# Patient Record
Sex: Male | Born: 2000 | Race: Black or African American | Hispanic: No | Marital: Single | Smoking: Never smoker
Health system: Southern US, Community
[De-identification: ages and names within clinical notes are randomized; demographics above are authoritative.]

---
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
## 2019-06-04 ENCOUNTER — Ambulatory Visit: Payer: Medicaid Other

## 2020-01-14 ENCOUNTER — Emergency Department: Payer: Medicaid Other

## 2020-01-14 ENCOUNTER — Emergency Department
Admission: EM | Admit: 2020-01-14 | Discharge: 2020-01-14 | Disposition: A | Discharge status: Home / Self Care | Payer: Medicaid Other | Attending: Emergency Medicine | Admitting: Emergency Medicine

## 2020-01-14 DIAGNOSIS — M546 Pain in thoracic spine: Secondary | ICD-10-CM | POA: Insufficient documentation

## 2020-01-14 DIAGNOSIS — M542 Cervicalgia: Secondary | ICD-10-CM | POA: Insufficient documentation

## 2020-01-14 DIAGNOSIS — R519 Headache, unspecified: Secondary | ICD-10-CM | POA: Insufficient documentation

## 2020-01-14 DIAGNOSIS — M25551 Pain in right hip: Secondary | ICD-10-CM | POA: Diagnosis not present

## 2020-01-14 DIAGNOSIS — Y92410 Unspecified street and highway as the place of occurrence of the external cause: Secondary | ICD-10-CM | POA: Insufficient documentation

## 2020-01-14 MED ORDER — CYCLOBENZAPRINE HCL 5 MG PO TABS: ORAL_TABLET | ORAL | 0 refills | Status: DC
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
## 2021-04-20 ENCOUNTER — Encounter: Payer: Self-pay | Admitting: Emergency Medicine

## 2021-04-20 ENCOUNTER — Other Ambulatory Visit: Payer: Self-pay

## 2021-04-20 ENCOUNTER — Emergency Department
Admission: EM | Admit: 2021-04-20 | Discharge: 2021-04-20 | Disposition: A | Discharge status: Home / Self Care | Payer: Medicaid Other | Attending: Emergency Medicine | Admitting: Emergency Medicine

## 2021-04-20 DIAGNOSIS — R112 Nausea with vomiting, unspecified: Secondary | ICD-10-CM | POA: Diagnosis present

## 2021-04-20 DIAGNOSIS — E860 Dehydration: Secondary | ICD-10-CM | POA: Diagnosis not present

## 2021-04-20 DIAGNOSIS — U071 COVID-19: Secondary | ICD-10-CM | POA: Diagnosis not present

## 2021-04-20 LAB — URINALYSIS, ROUTINE W REFLEX MICROSCOPIC
Glucose, UA: NEGATIVE mg/dL
Hgb urine dipstick: NEGATIVE
Ketones, ur: 80 mg/dL — AB
Nitrite: NEGATIVE
Protein, ur: >=300 mg/dL — AB
Specific Gravity, Urine: 1.033 — ABNORMAL HIGH (ref 1.005–1.030)
pH: 9 — ABNORMAL HIGH (ref 5.0–8.0)

## 2021-04-20 LAB — CBC
HCT: 44.6 % (ref 39.0–52.0)
Hemoglobin: 14.8 g/dL (ref 13.0–17.0)
MCH: 27.2 pg (ref 26.0–34.0)
MCHC: 33.2 g/dL (ref 30.0–36.0)
MCV: 82 fL (ref 80.0–100.0)
Platelets: 233 10*3/uL (ref 150–400)
RBC: 5.44 MIL/uL (ref 4.22–5.81)
RDW: 13.4 % (ref 11.5–15.5)
WBC: 7.3 10*3/uL (ref 4.0–10.5)
nRBC: 0 % (ref 0.0–0.2)

## 2021-04-20 LAB — COMPREHENSIVE METABOLIC PANEL
ALT: 22 U/L (ref 0–44)
AST: 24 U/L (ref 15–41)
Albumin: 4.6 g/dL (ref 3.5–5.0)
Alkaline Phosphatase: 70 U/L (ref 38–126)
Anion gap: 11 (ref 5–15)
BUN: 12 mg/dL (ref 6–20)
CO2: 21 mmol/L — ABNORMAL LOW (ref 22–32)
Calcium: 9.4 mg/dL (ref 8.9–10.3)
Chloride: 103 mmol/L (ref 98–111)
Creatinine, Ser: 1.02 mg/dL (ref 0.61–1.24)
GFR, Estimated: >60 mL/min (ref >60)
Glucose, Bld: 106 mg/dL — ABNORMAL HIGH (ref 70–99)
Potassium: 3.9 mmol/L (ref 3.5–5.1)
Sodium: 135 mmol/L (ref 135–145)
Total Bilirubin: 0.9 mg/dL (ref 0.3–1.2)
Total Protein: 7.9 g/dL (ref 6.5–8.1)

## 2021-04-20 LAB — RESP PANEL BY RT-PCR (FLU A&B, COVID) ARPGX2
Influenza A by PCR: NEGATIVE
Influenza B by PCR: NEGATIVE
SARS Coronavirus 2 by RT PCR: POSITIVE — AB

## 2021-04-20 LAB — LACTIC ACID, PLASMA: Lactic Acid, Venous: 1.4 mmol/L (ref 0.5–1.9)

## 2021-04-20 LAB — LIPASE, BLOOD: Lipase: 24 U/L (ref 11–51)

## 2021-04-20 MED ORDER — ONDANSETRON HCL 4 MG/2ML IJ SOLN
4.0000 mg | INTRAMUSCULAR | Status: AC
  Administered 2021-04-20: 4 mg via INTRAVENOUS
  Filled 2021-04-20: qty 2

## 2021-04-20 MED ORDER — LACTATED RINGERS IV BOLUS
1000.0000 mL | Freq: Once | INTRAVENOUS | Status: AC
  Administered 2021-04-20: 1000 mL via INTRAVENOUS

## 2021-04-20 MED ORDER — ONDANSETRON 4 MG PO TBDP: ORAL_TABLET | ORAL | 0 refills | Status: AC

## 2021-04-20 NOTE — ED Notes (Signed)
Pt refused discharge vitals at this time.

## 2021-04-20 NOTE — ED Provider Notes (Signed)
[facility] Provider Note    Event Date/Time   First MD Initiated Contact with Patient 04/20/21 [phone]     (approximate)   History   Nausea and Emesis   HPI  Jorge Mccullough is a 21 y.o. male who reports no chronic medical issues and presents for evaluation of persistent nausea and vomiting.  He said has been going on for 2 days.  He said he feels generally weak but it may be because he is not able to keep much down when he eats or drinks.  He denies fever, sore throat, chest pain, shortness of breath, and cough.  He has no pain, just the persistent nausea.  He last vomited before coming to the emergency department but he has been able to drink some water and eat some ice after coming to the ED.  Nothing in particular makes the symptoms better or worse.     Physical Exam   Triage Vital Signs: ED Triage Vitals  Enc Vitals Group     BP 04/20/21 0026 133/78     Pulse Rate 04/20/21 0026 (!) 101     Resp 04/20/21 0026 18     Temp 04/20/21 0026 100.1 F (37.8 C)     Temp src --      SpO2 04/20/21 0026 98 %     Weight 04/20/21 0026 83.9 kg (185 lb)     Height 04/20/21 0026 1.905 m (6\' 3" )     Head Circumference --      Peak Flow --      Pain Score 04/20/21 0026 0     Pain Loc --      Pain Edu? --      Excl. in GC? --     Most recent vital signs: Vitals:   04/20/21 0026 04/20/21 0125  BP: 133/78 132/84  Pulse: (!) 101   Resp: 18   Temp: 100.1 F (37.8 C)   SpO2: 98%      General: Awake, no distress.  CV:  Good peripheral perfusion.  Resp:  Normal effort.  Abd:  No distention.  No tenderness to palpation of the abdomen.   ED Results / Procedures / Treatments   Labs (all labs ordered are listed, but only abnormal results are displayed) Labs Reviewed  RESP PANEL BY RT-PCR (FLU A&B, COVID) ARPGX2 - Abnormal; Notable for the following components:      Result Value   SARS Coronavirus 2 by RT PCR POSITIVE (*)    All other components  within normal limits  COMPREHENSIVE METABOLIC PANEL - Abnormal; Notable for the following components:   CO2 21 (*)    Glucose, Bld 106 (*)    All other components within normal limits  URINALYSIS, ROUTINE W REFLEX MICROSCOPIC - Abnormal; Notable for the following components:   Color, Urine AMBER (*)    APPearance HAZY (*)    Specific Gravity, Urine 1.033 (*)    pH 9.0 (*)    Bilirubin Urine SMALL (*)    Ketones, ur 80 (*)    Protein, ur >=300 (*)    Leukocytes,Ua TRACE (*)    Bacteria, UA RARE (*)    All other components within normal limits  URINE CULTURE  LIPASE, BLOOD  CBC  LACTIC ACID, PLASMA  LACTIC ACID, PLASMA      MEDICATIONS ORDERED IN ED: Medications  lactated ringers bolus 1,000 mL (0 mLs Intravenous Stopped 04/20/21 0338)  ondansetron (ZOFRAN) injection 4 mg (4 mg Intravenous Given  04/20/21 0119)     IMPRESSION / MDM / ASSESSMENT AND PLAN / ED COURSE  I reviewed the triage vital signs and the nursing notes.                              Differential diagnosis includes, but is not limited to, viral infection, gastritis, acid reflux.  The patient is having no pains of biliary colic would be unlikely.  He has a reassuring physical exam, is well-appearing, no longer vomiting.  However his temperature is slightly elevated at 100.1 and his pulse is elevated at 101.  His blood pressure is normal.  I ordered respiratory viral panel, comprehensive metabolic panel, lipase, CBC with differential, urinalysis, urine culture, and lactic acid.  I reviewed the labs and his urinalysis shows some ketones and some protein and a little bit of blood, unclear clinical significance in the absence of any dysuria or other urological symptoms.  His CBC is within normal limits including no leukocytosis.  Lipase is normal, lactic acid is normal, and comprehensive metabolic panel is normal including no evidence of kidney dysfunction or LFT dysfunction.  However his respiratory viral panel is  positive for COVID-19, which does explain his vital sign abnormalities as well as provides an explanation for his nausea and vomiting.  I ordered LR 1 L IV bolus and Zofran 4 mg IV, after which he said he felt much better.  He was surprised by the COVID-19 diagnosis but understands its importance.  We had my usual and customary discussion about COVID-19 and he wishes to follow-up as an outpatient.  I explained to him about paxlovid and he declines that at this time in favor of a prescription for Zofran.  I gave my usual and customary return precautions.         FINAL CLINICAL IMPRESSION(S) / ED DIAGNOSES   Final diagnoses:  Nausea and vomiting, unspecified vomiting type  Dehydration  COVID-19     Rx / DC Orders   ED Discharge Orders          Ordered    ondansetron (ZOFRAN-ODT) 4 MG disintegrating tablet        04/20/21 0328             Note:  This document was prepared using Dragon voice recognition software and may include unintentional dictation errors.   Loleta Rose, MD 04/20/21 [phone]

## 2021-04-20 NOTE — ED Triage Notes (Signed)
Pt reports feeling nausea for a day reports poor appetite, vomiting. Pt talks in complete sentences no distress noted

## 2021-04-21 LAB — URINE CULTURE: Culture: <10000 — AB
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
## 2022-08-14 ENCOUNTER — Emergency Department
Admission: EM | Admit: 2022-08-14 | Discharge: 2022-08-15 | Disposition: A | Discharge status: Home / Self Care | Payer: Medicaid Other | Attending: Emergency Medicine | Admitting: Emergency Medicine

## 2022-08-14 ENCOUNTER — Other Ambulatory Visit: Payer: Self-pay

## 2022-08-14 DIAGNOSIS — R531 Weakness: Secondary | ICD-10-CM | POA: Insufficient documentation

## 2022-08-14 DIAGNOSIS — R55 Syncope and collapse: Secondary | ICD-10-CM | POA: Insufficient documentation

## 2022-08-14 DIAGNOSIS — X30XXXA Exposure to excessive natural heat, initial encounter: Secondary | ICD-10-CM | POA: Insufficient documentation

## 2022-08-14 DIAGNOSIS — R748 Abnormal levels of other serum enzymes: Secondary | ICD-10-CM | POA: Insufficient documentation

## 2022-08-14 DIAGNOSIS — T675XXA Heat exhaustion, unspecified, initial encounter: Secondary | ICD-10-CM | POA: Diagnosis not present

## 2022-08-14 DIAGNOSIS — R1110 Vomiting, unspecified: Secondary | ICD-10-CM | POA: Diagnosis present

## 2022-08-14 DIAGNOSIS — T679XXA Effect of heat and light, unspecified, initial encounter: Secondary | ICD-10-CM

## 2022-08-14 LAB — CBC
HCT: 45.6 % (ref 39.0–52.0)
Hemoglobin: 14.9 g/dL (ref 13.0–17.0)
MCH: 27.2 pg (ref 26.0–34.0)
MCHC: 32.7 g/dL (ref 30.0–36.0)
MCV: 83.2 fL (ref 80.0–100.0)
Platelets: 160 10*3/uL (ref 150–400)
RBC: 5.48 MIL/uL (ref 4.22–5.81)
RDW: 13.5 % (ref 11.5–15.5)
WBC: 10.9 10*3/uL — ABNORMAL HIGH (ref 4.0–10.5)
nRBC: 0 % (ref 0.0–0.2)

## 2022-08-14 LAB — CK: Total CK: 574 U/L — ABNORMAL HIGH (ref 49–397)

## 2022-08-14 LAB — COMPREHENSIVE METABOLIC PANEL
ALT: 24 U/L (ref 0–44)
AST: 28 U/L (ref 15–41)
Albumin: 4.8 g/dL (ref 3.5–5.0)
Alkaline Phosphatase: 84 U/L (ref 38–126)
Anion gap: 15 (ref 5–15)
BUN: 12 mg/dL (ref 6–20)
CO2: 17 mmol/L — ABNORMAL LOW (ref 22–32)
Calcium: 9.4 mg/dL (ref 8.9–10.3)
Chloride: 107 mmol/L (ref 98–111)
Creatinine, Ser: 0.97 mg/dL (ref 0.61–1.24)
GFR, Estimated: >60 mL/min (ref >60)
Glucose, Bld: 109 mg/dL — ABNORMAL HIGH (ref 70–99)
Potassium: 4.5 mmol/L (ref 3.5–5.1)
Sodium: 139 mmol/L (ref 135–145)
Total Bilirubin: 1.6 mg/dL — ABNORMAL HIGH (ref 0.3–1.2)
Total Protein: 7.7 g/dL (ref 6.5–8.1)

## 2022-08-14 LAB — LIPASE, BLOOD: Lipase: 21 U/L (ref 11–51)

## 2022-08-14 MED ORDER — ONDANSETRON 4 MG PO TBDP
4.0000 mg | ORAL_TABLET | Freq: Once | ORAL | Status: AC
  Administered 2022-08-14: 4 mg via ORAL
  Filled 2022-08-14: qty 1
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
## 2022-08-14 NOTE — ED Triage Notes (Signed)
Pt to ed from home for near syncope, and emesis. Pt has been playing basketball in the heat all week. Today he got too hot and has been vomiting all day. Pt is caox4, in no acute distress and ambulatory in triage. No active vomiting in triage.

## 2022-08-15 MED ORDER — ONDANSETRON HCL 4 MG/2ML IJ SOLN
4.0000 mg | Freq: Once | INTRAMUSCULAR | Status: AC
  Administered 2022-08-15: 4 mg via INTRAVENOUS
  Filled 2022-08-15: qty 2

## 2022-08-15 MED ORDER — SODIUM CHLORIDE 0.9 % IV BOLUS
1000.0000 mL | Freq: Once | INTRAVENOUS | Status: AC
  Administered 2022-08-15: 1000 mL via INTRAVENOUS

## 2022-08-15 NOTE — Discharge Instructions (Signed)
Stay indoors this weekend and drink plenty of fluids.  Return to the ER for worsening symptoms, persistent vomiting, difficulty breathing or other concerns.

## 2022-08-15 NOTE — ED Notes (Signed)
Patient provided ginger ale per request.

## 2022-10-06 ENCOUNTER — Other Ambulatory Visit: Payer: Self-pay

## 2022-10-06 ENCOUNTER — Emergency Department
Admission: EM | Admit: 2022-10-06 | Discharge: 2022-10-06 | Disposition: A | Discharge status: Home / Self Care | Payer: Medicaid Other | Attending: Emergency Medicine | Admitting: Emergency Medicine

## 2022-10-06 DIAGNOSIS — U071 COVID-19: Secondary | ICD-10-CM | POA: Insufficient documentation

## 2022-10-06 DIAGNOSIS — R42 Dizziness and giddiness: Secondary | ICD-10-CM | POA: Diagnosis not present

## 2022-10-06 DIAGNOSIS — R519 Headache, unspecified: Secondary | ICD-10-CM | POA: Diagnosis present

## 2022-10-06 LAB — CBC
HCT: 45.2 % (ref 39.0–52.0)
Hemoglobin: 14.8 g/dL (ref 13.0–17.0)
MCH: 26.9 pg (ref 26.0–34.0)
MCHC: 32.7 g/dL (ref 30.0–36.0)
MCV: 82.2 fL (ref 80.0–100.0)
Platelets: 239 10*3/uL (ref 150–400)
RBC: 5.5 MIL/uL (ref 4.22–5.81)
RDW: 13.3 % (ref 11.5–15.5)
WBC: 8.7 10*3/uL (ref 4.0–10.5)
nRBC: 0 % (ref 0.0–0.2)

## 2022-10-06 LAB — BASIC METABOLIC PANEL
Anion gap: 12 (ref 5–15)
BUN: 8 mg/dL (ref 6–20)
CO2: 20 mmol/L — ABNORMAL LOW (ref 22–32)
Calcium: 9.3 mg/dL (ref 8.9–10.3)
Chloride: 105 mmol/L (ref 98–111)
Creatinine, Ser: 0.95 mg/dL (ref 0.61–1.24)
GFR, Estimated: >60 mL/min (ref >60)
Glucose, Bld: 102 mg/dL — ABNORMAL HIGH (ref 70–99)
Potassium: 3.8 mmol/L (ref 3.5–5.1)
Sodium: 137 mmol/L (ref 135–145)

## 2022-10-06 LAB — SARS CORONAVIRUS 2 BY RT PCR: SARS Coronavirus 2 by RT PCR: POSITIVE — AB

## 2022-10-06 MED ORDER — SODIUM CHLORIDE 0.9 % IV SOLN: Freq: Once | INTRAVENOUS | Status: AC

## 2022-10-06 MED ORDER — KETOROLAC TROMETHAMINE 30 MG/ML IJ SOLN
30.0000 mg | Freq: Once | INTRAMUSCULAR | Status: AC
  Administered 2022-10-06: 30 mg via INTRAVENOUS
  Filled 2022-10-06: qty 1

## 2022-10-06 MED ORDER — ONDANSETRON HCL 4 MG/2ML IJ SOLN
4.0000 mg | Freq: Once | INTRAMUSCULAR | Status: AC
  Administered 2022-10-06: 4 mg via INTRAVENOUS
  Filled 2022-10-06: qty 2

## 2022-10-06 NOTE — ED Provider Notes (Signed)
[facility] Provider Note    Event Date/Time   First MD Initiated Contact with Patient 10/06/22 1020     (approximate)   History   Abdominal Pain, Emesis, Loss of Consciousness, and Weakness   HPI  Jorge Mccullough is a 22 y.o. male who presents with complaints of bodyaches, headaches, fatigue, dizziness which started this morning.  No abdominal pain reported.  No neck pain.  No shortness of breath occasional cough     Physical Exam   Triage Vital Signs: ED Triage Vitals  Encounter Vitals Group     BP 10/06/22 1012 105/84     Systolic BP Percentile --      Diastolic BP Percentile --      Pulse Rate 10/06/22 1012 90     Resp 10/06/22 1012 14     Temp 10/06/22 1012 (!) 100.9 F (38.3 C)     Temp src --      SpO2 10/06/22 1012 96 %     Weight 10/06/22 1014 86.2 kg (190 lb)     Height 10/06/22 1014 1.905 m (6\' 3" )     Head Circumference --      Peak Flow --      Pain Score 10/06/22 1013 10     Pain Loc --      Pain Education --      Exclude from Growth Chart --     Most recent vital signs: Vitals:   10/06/22 1030 10/06/22 1145  BP: 135/72   Pulse: 67 86  Resp:    Temp:    SpO2: 100% 97%     General: Awake, no distress.  CV:  Good peripheral perfusion.  Resp:  Normal effort.  Abd:  No distention.  Soft, nontender Other:     ED Results / Procedures / Treatments   Labs (all labs ordered are listed, but only abnormal results are displayed) Labs Reviewed  SARS CORONAVIRUS 2 BY RT PCR - Abnormal; Notable for the following components:      Result Value   SARS Coronavirus 2 by RT PCR POSITIVE (*)    All other components within normal limits  BASIC METABOLIC PANEL - Abnormal; Notable for the following components:   CO2 20 (*)    Glucose, Bld 102 (*)    All other components within normal limits  CBC     EKG  ED ECG REPORT I, Jene Every, the attending physician, personally viewed and interpreted this ECG.  Date:  10/06/2022  Rhythm: normal sinus rhythm QRS Axis: normal Intervals: normal ST/T Wave abnormalities: normal Narrative Interpretation: no evidence of acute ischemia    RADIOLOGY     PROCEDURES:  Critical Care performed:   Procedures   MEDICATIONS ORDERED IN ED: Medications  0.9 %  sodium chloride infusion (0 mLs Intravenous Stopped 10/06/22 1147)  ondansetron (ZOFRAN) injection 4 mg (4 mg Intravenous Given 10/06/22 1053)  ketorolac (TORADOL) 30 MG/ML injection 30 mg (30 mg Intravenous Given 10/06/22 1053)     IMPRESSION / MDM / ASSESSMENT AND PLAN / ED COURSE  I reviewed the triage vital signs and the nursing notes. Patient's presentation is most consistent with acute illness / injury with system symptoms.  Patient presents with symptoms as above, suspicious for viral illness, possibly COVID.  COVID PCR pending.  Will obtain labs, give IV fluids, IV Toradol, IV Zofran and reevaluate.  EKG is reassuring.   COVID test is positive, patient feels much better after treatment, appropriate for  discharge at this time with outpatient follow-up as needed     FINAL CLINICAL IMPRESSION(S) / ED DIAGNOSES   Final diagnoses:  COVID-19     Rx / DC Orders   ED Discharge Orders     None        Note:  This document was prepared using Dragon voice recognition software and may include unintentional dictation errors.   Jene Every, MD 10/08/22 [phone]

## 2022-10-06 NOTE — ED Triage Notes (Signed)
Pt states that he started to feel bad yesterday, states he's always around people and is uncertain if he was exposed to illness, pt states he started with chills, headache, upper abd pain, weakness, states he began to vomit last pm and passed out 4 times this am, pt states that he doesn't usually pass out with vomiting

## 2022-10-06 NOTE — ED Notes (Signed)
See triage notes. Patient stated he began throwing up and passing out yesterday. States he "just feels terrible." Patient was noted to be very tired seeming.

## 2023-05-12 ENCOUNTER — Ambulatory Visit: Payer: Self-pay | Admitting: Family Medicine

## 2023-06-28 ENCOUNTER — Other Ambulatory Visit: Payer: Self-pay

## 2023-06-28 ENCOUNTER — Encounter: Payer: Self-pay | Admitting: Emergency Medicine

## 2023-06-28 ENCOUNTER — Ambulatory Visit
Admission: EM | Admit: 2023-06-28 | Discharge: 2023-06-28 | Disposition: A | Discharge status: Home / Self Care | Attending: Emergency Medicine | Admitting: Emergency Medicine

## 2023-06-28 DIAGNOSIS — B9689 Other specified bacterial agents as the cause of diseases classified elsewhere: Secondary | ICD-10-CM | POA: Diagnosis not present

## 2023-06-28 DIAGNOSIS — J302 Other seasonal allergic rhinitis: Secondary | ICD-10-CM

## 2023-06-28 DIAGNOSIS — H109 Unspecified conjunctivitis: Secondary | ICD-10-CM | POA: Diagnosis not present

## 2023-06-28 MED ORDER — POLYMYXIN B-TRIMETHOPRIM 10000-0.1 UNIT/ML-% OP SOLN: 1.0000 [drp] | Freq: Four times a day (QID) | OPHTHALMIC | 0 refills | Status: AC

## 2023-06-28 MED ORDER — CETIRIZINE-PSEUDOEPHEDRINE ER 5-120 MG PO TB12: 1.0000 | ORAL_TABLET | Freq: Every day | ORAL | 2 refills | Status: AC

## 2023-06-28 NOTE — Discharge Instructions (Addendum)
 Today you were evaluated for your symptoms which I believe you believe are related to your seasonal allergies  As you are experiencing drainage to the eyes as well as itching and redness will provide coverage for bacteria, Place 1 drop of Polytrim  into both eyes every 8 hours for 7 days  Use of Zyrtec  the every evening before bed which has additional decongestant to help with symptoms, use nightly before bed until all symptoms have resolved   For cough: honey 1/2 to 1 teaspoon (you can dilute the honey in water or another fluid).  You can also use guaifenesin and dextromethorphan for cough. You can use a humidifier for chest congestion and cough.  If you don't have a humidifier, you can sit in the bathroom with the hot shower running.      For sore throat: try warm salt water gargles, cepacol lozenges, throat spray, warm tea or water with lemon/honey, popsicles or ice, or OTC cold relief medicine for throat discomfort.   For congestion: take a daily anti-histamine like Zyrtec , Claritin, and a oral decongestant, such as pseudoephedrine .  You can also use Flonase 1-2 sprays in each nostril daily.   It is important to stay hydrated: drink plenty of fluids (water, gatorade/powerade/pedialyte, juices, or teas) to keep your throat moisturized and help further relieve irritation/discomfort.

## 2023-06-28 NOTE — ED Provider Notes (Signed)
 [name]    CSN: [id number] Arrival date & time: 06/28/23  1423      History   Chief Complaint Chief Complaint  Patient presents with   Headache    HPI Jorge Mccullough is a 23 y.o. male.   For evaluation of generalized headaches, bilateral eye drainage with matting upon awakening, itching, erythema and pain, sneezing and nasal congestion beginning this morning.  No known sick contacts.  Tolerating food and liquids.  Has not attempted treatment.  History reviewed. No pertinent past medical history.  There are no active problems to display for this patient.   History reviewed. No pertinent surgical history.     Home Medications    Prior to Admission medications   Medication Sig Start Date End Date Taking? Authorizing Provider  cetirizine -pseudoephedrine  (ZYRTEC -D) 5-120 MG tablet Take 1 tablet by mouth daily. 06/28/23  Yes Koula Venier R, NP  trimethoprim -polymyxin b  (POLYTRIM ) ophthalmic solution Place 1 drop into both eyes every 6 (six) hours. 06/28/23  Yes Margeret Stachnik, Maybelle Spatz, NP  ondansetron  (ZOFRAN -ODT) 4 MG disintegrating tablet Allow 1-2 tablets to dissolve in your mouth every 8 hours as needed for nausea/vomiting 04/20/21   Lynnda Sas, MD    Family History History reviewed. No pertinent family history.  Social History Social History   Tobacco Use   Smoking status: Never   Smokeless tobacco: Never  Substance Use Topics   Alcohol use: No   Drug use: No     Allergies   Grass extracts [gramineae pollens] and Other   Review of Systems Review of Systems  Neurological:  Positive for headaches.     Physical Exam Triage Vital Signs ED Triage Vitals [06/28/23 1444]  Encounter Vitals Group     BP 133/88     Systolic BP Percentile      Diastolic BP Percentile      Pulse Rate 62     Resp 16     Temp 98.9 F (37.2 C)     Temp Source Oral     SpO2 98 %     Weight      Height      Head Circumference      Peak Flow      Pain  Score 7     Pain Loc      Pain Education      Exclude from Growth Chart    No data found.  Updated Vital Signs BP 133/88 (BP Location: Left Arm)   Pulse 62   Temp 98.9 F (37.2 C) (Oral)   Resp 16   SpO2 98%   Visual Acuity Right Eye Distance:   Left Eye Distance:   Bilateral Distance:    Right Eye Near:   Left Eye Near:    Bilateral Near:     Physical Exam Constitutional:      Appearance: Normal appearance.  HENT:     Right Ear: Tympanic membrane, ear canal and external ear normal.     Left Ear: Tympanic membrane, ear canal and external ear normal.     Nose: Nose normal.  Eyes:     Comments: Mild erythema present to the bilateral conjunctiva, no drainage noted on exam, vision grossly intact, extraocular movements intact  Pulmonary:     Effort: Pulmonary effort is normal.  Neurological:     General: No focal deficit present.     Mental Status: He is alert and oriented to person, place, and time. Mental status is at baseline.  Cranial Nerves: No cranial nerve deficit.     Motor: No weakness.      UC Treatments / Results  Labs (all labs ordered are listed, but only abnormal results are displayed) Labs Reviewed - No data to display  EKG   Radiology No results found.  Procedures Procedures (including critical care time)  Medications Ordered in UC Medications - No data to display  Initial Impression / Assessment and Plan / UC Course  I have reviewed the triage vital signs and the nursing notes.  Pertinent labs & imaging results that were available during my care of the patient were reviewed by me and considered in my medical decision making (see chart for details).   Bacterial conjunctivitis of both eyes, seasonal allergies  Symptomology of the eyes is most consistent with bacterial conjunctivitis and patient is endorsing drainage with matting therefore will provide coverage, prescribed Polytrim  and discussed administration, etiology of remaining  symptoms most likely seasonal allergies, denies fever, declined viral testing, prescribed Zyrtec -D for management and recommended supportive care through over-the-counter medicine and nonpharmacological measures, may follow-up as needed Final Clinical Impressions(s) / UC Diagnoses   Final diagnoses:  Bacterial conjunctivitis of both eyes  Seasonal allergies     Discharge Instructions      Today you were evaluated for your symptoms which I believe you believe are related to your seasonal allergies  As you are experiencing drainage to the eyes as well as itching and redness will provide coverage for bacteria, Place 1 drop of Polytrim  into both eyes every 8 hours for 7 days  Use of Zyrtec  the every evening before bed which has additional decongestant to help with symptoms, use nightly before bed until all symptoms have resolved   For cough: honey 1/2 to 1 teaspoon (you can dilute the honey in water or another fluid).  You can also use guaifenesin and dextromethorphan for cough. You can use a humidifier for chest congestion and cough.  If you don't have a humidifier, you can sit in the bathroom with the hot shower running.      For sore throat: try warm salt water gargles, cepacol lozenges, throat spray, warm tea or water with lemon/honey, popsicles or ice, or OTC cold relief medicine for throat discomfort.   For congestion: take a daily anti-histamine like Zyrtec , Claritin, and a oral decongestant, such as pseudoephedrine .  You can also use Flonase 1-2 sprays in each nostril daily.   It is important to stay hydrated: drink plenty of fluids (water, gatorade/powerade/pedialyte, juices, or teas) to keep your throat moisturized and help further relieve irritation/discomfort.    ED Prescriptions     Medication Sig Dispense Auth. Provider   cetirizine -pseudoephedrine  (ZYRTEC -D) 5-120 MG tablet Take 1 tablet by mouth daily. 30 tablet Eliz Nigg R, NP   trimethoprim -polymyxin b  (POLYTRIM )  ophthalmic solution Place 1 drop into both eyes every 6 (six) hours. 10 mL Reena Canning, NP      PDMP not reviewed this encounter.   Reena Canning, NP 06/28/23 1714

## 2023-06-28 NOTE — ED Triage Notes (Signed)
 Patient presents to [facility] for evaluation of headache, eyes crusted shut, swollen face and headache upon waking up this morning.  Took Benadryl and Tylenol  without much relief.

## 2023-12-20 ENCOUNTER — Ambulatory Visit: Admitting: Family Medicine
